# Patient Record
Sex: Male | Born: 1970 | Hispanic: Yes | Smoking: Never smoker
Health system: Southern US, Community
[De-identification: ages and names within clinical notes are randomized; demographics above are authoritative.]

## PROBLEM LIST (undated history)

## (undated) DIAGNOSIS — E785 Hyperlipidemia, unspecified: Secondary | ICD-10-CM

## (undated) HISTORY — DX: Hyperlipidemia, unspecified: E78.5

## (undated) HISTORY — PX: NO PAST SURGERIES: SHX2092

---

## 2019-10-05 ENCOUNTER — Ambulatory Visit (INDEPENDENT_AMBULATORY_CARE_PROVIDER_SITE_OTHER): Payer: 59 | Admitting: Family Medicine

## 2019-10-05 ENCOUNTER — Encounter: Payer: Self-pay | Admitting: Family Medicine

## 2019-10-05 ENCOUNTER — Ambulatory Visit (INDEPENDENT_AMBULATORY_CARE_PROVIDER_SITE_OTHER)
Admission: RE | Admit: 2019-10-05 | Discharge: 2019-10-05 | Disposition: A | Discharge status: Home / Self Care | Payer: 59 | Source: Ambulatory Visit | Attending: Family Medicine | Admitting: Family Medicine

## 2019-10-05 ENCOUNTER — Other Ambulatory Visit: Payer: Self-pay

## 2019-10-05 VITALS — BP 118/70 | HR 72 | Temp 98.4°F | Ht 66.0 in | Wt 186.8 lb

## 2019-10-05 DIAGNOSIS — E781 Pure hyperglyceridemia: Secondary | ICD-10-CM

## 2019-10-05 DIAGNOSIS — M898X8 Other specified disorders of bone, other site: Secondary | ICD-10-CM | POA: Diagnosis not present

## 2019-10-05 DIAGNOSIS — R350 Frequency of micturition: Secondary | ICD-10-CM | POA: Diagnosis not present

## 2019-10-05 DIAGNOSIS — R0989 Other specified symptoms and signs involving the circulatory and respiratory systems: Secondary | ICD-10-CM | POA: Diagnosis not present

## 2019-10-05 DIAGNOSIS — D649 Anemia, unspecified: Secondary | ICD-10-CM | POA: Diagnosis not present

## 2019-10-05 DIAGNOSIS — E785 Hyperlipidemia, unspecified: Secondary | ICD-10-CM

## 2019-10-05 DIAGNOSIS — Z7689 Persons encountering health services in other specified circumstances: Secondary | ICD-10-CM | POA: Diagnosis not present

## 2019-10-05 NOTE — Progress Notes (Signed)
Subjective:    Patient ID: Nathaniel Cook, male    DOB: [birth year], 49 y.o.   MRN: [id number]  HPI Chief Complaint  Patient presents with  . New Patient (Initial Visit)    Trouble with abdomen - lumps at sternum //  Dizziness x 5-6 months - started only having 1-2 times per week but now every day - Establish care.     This is a 49 yo male who presents today with above cc and to establish care. Jamas Lav is interpreter. He works in [employer]. Lives with his brother. Enjoys walking. Has one brother and three sisters.   Last CPE- at [facility], in the last year PSA- unknown Colonoscopy- never Tdap-unsure, will obtain records Flu-declines Dental-not regular Eye-not regular Exercise-active at work and enjoys walking  Last seen at [facility] 10/20. Was told that his triglycerides were high and he had anemia.   Bumps along sternum for many years. Not painful, not getting bigger.   Dizziness- comes and goes, no trigger. Better with laying down. Today is better. Started last week. Every day. Feels dizzy when in the car, riding with his brother. No nausea. Headaches across front. Occasional left ear pain. Two weeks ago had runny nose and cough. One day of fever.  Feels very well today with no complaints.  No history of elevated blood sugar. Father with diabetes.   Review of Systems  No chest pain, no SOB, no diarrhea/ constipation/blood in stool. Some urinary frequency, nocturia x 1. No change in weight. No night sweats.        Objective:   Physical Exam Vitals reviewed.  Constitutional:      General: He is not in acute distress.    Appearance: Normal appearance. He is obese. He is not ill-appearing, toxic-appearing or diaphoretic.  HENT:     Right Ear: Tympanic membrane, ear canal and external ear normal.     Left Ear: Tympanic membrane, ear canal and external ear normal.  Eyes:     Conjunctiva/sclera: Conjunctivae normal.  Cardiovascular:      Rate and Rhythm: Normal rate and regular rhythm.     Heart sounds: Normal heart sounds.  Pulmonary:     Effort: Pulmonary effort is normal.     Comments: Slightly decreased breath sounds posteriorly. Chest:     Chest wall: Mass (superficial, round, palpable lesions lower sternum. Approximately 1-1.5 cm. ) present. No tenderness.  Abdominal:     General: Abdomen is flat. Bowel sounds are normal. There is no distension.     Palpations: Abdomen is soft. There is no mass.     Tenderness: There is no abdominal tenderness. There is no guarding or rebound.     Hernia: No hernia is present.  Musculoskeletal:     Cervical back: Normal range of motion and neck supple. No rigidity or tenderness.     Right lower leg: No edema.     Left lower leg: No edema.  Lymphadenopathy:     Cervical: No cervical adenopathy.  Skin:    General: Skin is warm and dry.     Comments: Lesions similar in size and consistency on bilateral upper inner arms.  Also x 1 on back.  Neurological:     Mental Status: He is alert and oriented to person, place, and time.  Psychiatric:        Mood and Affect: Mood normal.        Behavior: Behavior normal.        Thought  Content: Thought content normal.        Judgment: Judgment normal.       BP 118/70 (BP Location: Right Arm, Patient Position: Sitting, Cuff Size: Normal)   Pulse 72   Temp 98.4 F (36.9 C) (Temporal)   Ht 5\' 6"  (1.676 m) Comment: 5  Wt 186 lb 12.8 oz (84.7 kg)   SpO2 98%   BMI 30.15 kg/m      Assessment & Plan:  1. Encounter to establish care -We will request records from prior PCP  2. Anemia, unspecified type -Per patient report, will check labs - CBC with Differential - Ferritin  3. High triglycerides -Per patient report, will check labs - Lipid Panel - Comprehensive metabolic panel  4. Urinary frequency -Family history of diabetes - Hemoglobin A1c  5. Sternal mass -Could be lipomas as he has similar lesions on his arms and  back, will check x-ray - DG Sternum; Future  6. Abnormal lung sounds -Decreased breath sounds, will check chest x-ray along with x-ray of sternum - DG Chest 2 View; Future  He reports that there is someone at home who can help with translation on the phone regarding results.  -Follow-up to be determined based on lab and x-ray findings  This visit occurred during the SARS-CoV-2 public health emergency.  Safety protocols were in place, including screening questions prior to the visit, additional usage of staff PPE, and extensive cleaning of exam room while observing appropriate contact time as indicated for disinfecting solutions.     , FNP-BC  [facility], [facility]  10/06/2019 12:06 PM

## 2019-10-05 NOTE — Patient Instructions (Signed)
Good to meet you today  Our office will call with results and make a follow up appointment  Call us if you have any problems or questions

## 2019-10-06 LAB — LDL CHOLESTEROL, DIRECT: Direct LDL: 142 mg/dL

## 2019-10-06 LAB — CBC WITH DIFFERENTIAL/PLATELET
Basophils Absolute: 0 10*3/uL (ref 0.0–0.1)
Basophils Relative: 0.6 % (ref 0.0–3.0)
Eosinophils Absolute: 0.1 10*3/uL (ref 0.0–0.7)
Eosinophils Relative: 1.7 % (ref 0.0–5.0)
HCT: 47.2 % (ref 39.0–52.0)
Hemoglobin: 16.1 g/dL (ref 13.0–17.0)
Lymphocytes Relative: 26.5 % (ref 12.0–46.0)
Lymphs Abs: 1.9 10*3/uL (ref 0.7–4.0)
MCHC: 34.1 g/dL (ref 30.0–36.0)
MCV: 84.5 fl (ref 78.0–100.0)
Monocytes Absolute: 0.5 10*3/uL (ref 0.1–1.0)
Monocytes Relative: 6.6 % (ref 3.0–12.0)
Neutro Abs: 4.6 10*3/uL (ref 1.4–7.7)
Neutrophils Relative %: 64.6 % (ref 43.0–77.0)
Platelets: 155 10*3/uL (ref 150.0–400.0)
RBC: 5.59 Mil/uL (ref 4.22–5.81)
RDW: 12.8 % (ref 11.5–15.5)
WBC: 7.1 10*3/uL (ref 4.0–10.5)

## 2019-10-06 LAB — LIPID PANEL
Cholesterol: 211 mg/dL — ABNORMAL HIGH (ref 0–200)
HDL: 35.6 mg/dL — ABNORMAL LOW (ref >39.00)
NonHDL: 174.91
Total CHOL/HDL Ratio: 6
Triglycerides: 348 mg/dL — ABNORMAL HIGH (ref 0.0–149.0)
VLDL: 69.6 mg/dL — ABNORMAL HIGH (ref 0.0–40.0)

## 2019-10-06 LAB — COMPREHENSIVE METABOLIC PANEL
ALT: 16 U/L (ref 0–53)
AST: 15 U/L (ref 0–37)
Albumin: 4.2 g/dL (ref 3.5–5.2)
Alkaline Phosphatase: 88 U/L (ref 39–117)
BUN: 18 mg/dL (ref 6–23)
CO2: 28 mEq/L (ref 19–32)
Calcium: 9.5 mg/dL (ref 8.4–10.5)
Chloride: 105 mEq/L (ref 96–112)
Creatinine, Ser: 1.08 mg/dL (ref 0.40–1.50)
GFR: 72.86 mL/min (ref >60.00)
Glucose, Bld: 103 mg/dL — ABNORMAL HIGH (ref 70–99)
Potassium: 3.8 mEq/L (ref 3.5–5.1)
Sodium: 140 mEq/L (ref 135–145)
Total Bilirubin: 0.5 mg/dL (ref 0.2–1.2)
Total Protein: 7 g/dL (ref 6.0–8.3)

## 2019-10-06 LAB — FERRITIN: Ferritin: 85.9 ng/mL (ref 22.0–322.0)

## 2019-10-06 LAB — HEMOGLOBIN A1C: Hgb A1c MFr Bld: 5.3 % (ref 4.6–6.5)

## 2019-10-06 MED ORDER — ATORVASTATIN CALCIUM 20 MG PO TABS: 20.0000 mg | ORAL_TABLET | Freq: Every day | ORAL | 3 refills | Status: DC

## 2020-11-07 ENCOUNTER — Encounter: Payer: Self-pay | Admitting: Family Medicine

## 2020-11-07 ENCOUNTER — Other Ambulatory Visit: Payer: Self-pay

## 2020-11-07 ENCOUNTER — Ambulatory Visit: Payer: 59 | Admitting: Family Medicine

## 2020-11-07 VITALS — BP 110/80 | HR 70 | Temp 96.8°F | Ht 68.5 in | Wt 186.0 lb

## 2020-11-07 DIAGNOSIS — Z1211 Encounter for screening for malignant neoplasm of colon: Secondary | ICD-10-CM

## 2020-11-07 DIAGNOSIS — E782 Mixed hyperlipidemia: Secondary | ICD-10-CM | POA: Diagnosis not present

## 2020-11-07 DIAGNOSIS — Z0000 Encounter for general adult medical examination without abnormal findings: Secondary | ICD-10-CM

## 2020-11-07 DIAGNOSIS — Z131 Encounter for screening for diabetes mellitus: Secondary | ICD-10-CM | POA: Diagnosis not present

## 2020-11-07 DIAGNOSIS — Z125 Encounter for screening for malignant neoplasm of prostate: Secondary | ICD-10-CM

## 2020-11-07 DIAGNOSIS — Z79899 Other long term (current) drug therapy: Secondary | ICD-10-CM

## 2020-11-07 LAB — CBC WITH DIFFERENTIAL/PLATELET
Basophils Absolute: 0 10*3/uL (ref 0.0–0.1)
Basophils Relative: 0.6 % (ref 0.0–3.0)
Eosinophils Absolute: 0.7 10*3/uL (ref 0.0–0.7)
Eosinophils Relative: 10.2 % — ABNORMAL HIGH (ref 0.0–5.0)
HCT: 50.1 % (ref 39.0–52.0)
Hemoglobin: 16.9 g/dL (ref 13.0–17.0)
Lymphocytes Relative: 28.2 % (ref 12.0–46.0)
Lymphs Abs: 1.8 10*3/uL (ref 0.7–4.0)
MCHC: 33.9 g/dL (ref 30.0–36.0)
MCV: 84.7 fl (ref 78.0–100.0)
Monocytes Absolute: 0.3 10*3/uL (ref 0.1–1.0)
Monocytes Relative: 5.3 % (ref 3.0–12.0)
Neutro Abs: 3.6 10*3/uL (ref 1.4–7.7)
Neutrophils Relative %: 55.7 % (ref 43.0–77.0)
Platelets: 150 10*3/uL (ref 150.0–400.0)
RBC: 5.91 Mil/uL — ABNORMAL HIGH (ref 4.22–5.81)
RDW: 13.1 % (ref 11.5–15.5)
WBC: 6.4 10*3/uL (ref 4.0–10.5)

## 2020-11-07 LAB — LIPID PANEL
Cholesterol: 238 mg/dL — ABNORMAL HIGH (ref 0–200)
HDL: 39 mg/dL — ABNORMAL LOW (ref >39.00)
LDL Cholesterol: 177 mg/dL — ABNORMAL HIGH (ref 0–99)
NonHDL: 199.48
Total CHOL/HDL Ratio: 6
Triglycerides: 114 mg/dL (ref 0.0–149.0)
VLDL: 22.8 mg/dL (ref 0.0–40.0)

## 2020-11-07 LAB — HEPATIC FUNCTION PANEL
ALT: 22 U/L (ref 0–53)
AST: 22 U/L (ref 0–37)
Albumin: 4.3 g/dL (ref 3.5–5.2)
Alkaline Phosphatase: 89 U/L (ref 39–117)
Bilirubin, Direct: 0.1 mg/dL (ref 0.0–0.3)
Total Bilirubin: 0.5 mg/dL (ref 0.2–1.2)
Total Protein: 7.2 g/dL (ref 6.0–8.3)

## 2020-11-07 LAB — BASIC METABOLIC PANEL
BUN: 18 mg/dL (ref 6–23)
CO2: 32 mEq/L (ref 19–32)
Calcium: 9.7 mg/dL (ref 8.4–10.5)
Chloride: 103 mEq/L (ref 96–112)
Creatinine, Ser: 1.15 mg/dL (ref 0.40–1.50)
GFR: 74.75 mL/min (ref >60.00)
Glucose, Bld: 97 mg/dL (ref 70–99)
Potassium: 4.9 mEq/L (ref 3.5–5.1)
Sodium: 139 mEq/L (ref 135–145)

## 2020-11-07 LAB — HEMOGLOBIN A1C: Hgb A1c MFr Bld: 5.3 % (ref 4.6–6.5)

## 2020-11-07 NOTE — Progress Notes (Signed)
Angelia Hazell T. Farin Buhman, MD, CAQ Sports Medicine  Primary Care and Sports Medicine [facility] at [facility] [address] [place], [address]  Phone: [phone]  FAX: [phone]  Nathaniel Cook - 50 y.o. male  MRN [id number]  Date of Birth: 18-Oct-1970  Date: 11/07/2020  PCP: Emi Belfast, FNP (Inactive)  Referral: No ref. provider found  Chief Complaint  Patient presents with  . Annual Exam  . Itchy Throat  . Nasal Congestion    This visit occurred during the SARS-CoV-2 public health emergency.  Safety protocols were in place, including screening questions prior to the visit, additional usage of staff PPE, and extensive cleaning of exam room while observing appropriate contact time as indicated for disinfecting solutions.   Patient Care Team: Emi Belfast, FNP (Inactive) as PCP - General (Nurse Practitioner) Subjective:   Nathaniel Cook is a 50 y.o. pleasant patient who presents with the following:  Preventative Health Maintenance Visit:  Health Maintenance Summary Reviewed and updated, unless pt declines services.  Tobacco History Reviewed. Alcohol: No concerns, no excessive use Exercise Habits: Some activity, rec at least 30 mins 5 times a week STD concerns: no risk or activity to increase risk Drug Use: None  Colon Pan-lab, hep c, hiv tdap?  covid vacc - has had 2 shots  Worried about an itchy throat.  Some benadryl will help some.   Stopped his chol meds - stopped  History reviewed. No pertinent past medical history.  Past Surgical History:  Procedure Laterality Date  . NO PAST SURGERIES      Family History  Problem Relation Age of Onset  . Hypertension Mother     Past Medical History, Surgical History, Social History, Family History, Problem List, Medications, and Allergies have been reviewed and updated if relevant.  Review of Systems: Pertinent positives are listed above.  Otherwise, a full 14 point review  of systems has been done in full and it is negative except where it is noted positive.  Objective:   BP 110/80   Pulse 70   Temp (!) 96.8 F (36 C) (Temporal)   Ht 5' 8.5" (1.74 m)   Wt 186 lb (84.4 kg)   SpO2 98%   BMI 27.87 kg/m  Ideal Body Weight: Weight in (lb) to have BMI = 25: 166.5  Ideal Body Weight: Weight in (lb) to have BMI = 25: 166.5 No exam data present Depression screen [facility] 2/9 11/07/2020 10/05/2019  Decreased Interest 0 2  Down, Depressed, Hopeless 0 3  PHQ - 2 Score 0 5  Altered sleeping - 0  Tired, decreased energy - 2  Change in appetite - 2  Feeling bad or failure about yourself  - 1  Trouble concentrating - 2  Moving slowly or fidgety/restless - 1  Suicidal thoughts - 0  PHQ-9 Score - 13  Difficult doing work/chores - Not difficult at all     GEN: well developed, well nourished, no acute distress Eyes: conjunctiva and lids normal, PERRLA, EOMI ENT: TM clear, nares clear, oral exam WNL Neck: supple, no lymphadenopathy, no thyromegaly, no JVD Pulm: clear to auscultation and percussion, respiratory effort normal CV: regular rate and rhythm, S1-S2, no murmur, rub or gallop, no bruits, peripheral pulses normal and symmetric, no cyanosis, clubbing, edema or varicosities GI: soft, non-tender; no hepatosplenomegaly, masses; active bowel sounds all quadrants GU: deferred Lymph: no cervical, axillary or inguinal adenopathy MSK: gait normal, muscle tone and strength WNL, no joint swelling, effusions, discoloration,  crepitus  SKIN: clear, good turgor, color WNL, no rashes, lesions, or ulcerations Neuro: normal mental status, normal strength, sensation, and motion Psych: alert; oriented to person, place and time, normally interactive and not anxious or depressed in appearance.  All labs reviewed with patient. Results for orders placed or performed in visit on 11/07/20  Basic metabolic panel  Result Value Ref Range   Sodium 139 135 - 145 mEq/L   Potassium 4.9  3.5 - 5.1 mEq/L   Chloride 103 96 - 112 mEq/L   CO2 32 19 - 32 mEq/L   Glucose, Bld 97 70 - 99 mg/dL   BUN 18 6 - 23 mg/dL   Creatinine, Ser 2.69 0.40 - 1.50 mg/dL   GFR 48.54 >62.70 mL/min   Calcium 9.7 8.4 - 10.5 mg/dL  CBC with Differential/Platelet  Result Value Ref Range   WBC 6.4 4.0 - 10.5 K/uL   RBC 5.91 (H) 4.22 - 5.81 Mil/uL   Hemoglobin 16.9 13.0 - 17.0 g/dL   HCT 35.0 09.3 - 81.8 %   MCV 84.7 78.0 - 100.0 fl   MCHC 33.9 30.0 - 36.0 g/dL   RDW 29.9 37.1 - 69.6 %   Platelets 150.0 150.0 - 400.0 K/uL   Neutrophils Relative % 55.7 43.0 - 77.0 %   Lymphocytes Relative 28.2 12.0 - 46.0 %   Monocytes Relative 5.3 3.0 - 12.0 %   Eosinophils Relative 10.2 (H) 0.0 - 5.0 %   Basophils Relative 0.6 0.0 - 3.0 %   Neutro Abs 3.6 1.4 - 7.7 K/uL   Lymphs Abs 1.8 0.7 - 4.0 K/uL   Monocytes Absolute 0.3 0.1 - 1.0 K/uL   Eosinophils Absolute 0.7 0.0 - 0.7 K/uL   Basophils Absolute 0.0 0.0 - 0.1 K/uL  Hepatic function panel  Result Value Ref Range   Total Bilirubin 0.5 0.2 - 1.2 mg/dL   Bilirubin, Direct 0.1 0.0 - 0.3 mg/dL   Alkaline Phosphatase 89 39 - 117 U/L   AST 22 0 - 37 U/L   ALT 22 0 - 53 U/L   Total Protein 7.2 6.0 - 8.3 g/dL   Albumin 4.3 3.5 - 5.2 g/dL  Lipid panel  Result Value Ref Range   Cholesterol 238 (H) 0 - 200 mg/dL   Triglycerides 789.3 0.0 - 149.0 mg/dL   HDL 81.01 (L) >75.10 mg/dL   VLDL 25.8 0.0 - 52.7 mg/dL   LDL Cholesterol 782 (H) 0 - 99 mg/dL   Total CHOL/HDL Ratio 6    NonHDL 199.48   Hemoglobin A1c  Result Value Ref Range   Hgb A1c MFr Bld 5.3 4.6 - 6.5 %    Assessment and Plan:     ICD-10-CM   1. Healthcare maintenance  Z00.00   2. Colon cancer screening  Z12.11 Ambulatory referral to Gastroenterology  3. Mixed hyperlipidemia  E78.2 Lipid panel  4. Screening for diabetes mellitus  Z13.1 Basic metabolic panel    Hemoglobin A1c  5. Screening PSA (prostate specific antigen)  Z12.5 PSA, Total with Reflex to PSA, Free  6. Encounter for  long-term (current) use of medications  Z79.899 CBC with Differential/Platelet    Hepatic function panel   Overall, he is doing well.  I encouraged him to be as active as possible.  We also need to do some baseline labs.  At the time of this dictation, labs have returned and his cholesterol has increased off of medication.  The 10-year ASCVD risk score Denman George DC Montez Hageman., et al., 2013)  is: 4.1%   Values used to calculate the score:     Age: 68 years     Sex: Male     Is Non-Hispanic African American: No     Diabetic: No     Tobacco smoker: No     Systolic Blood Pressure: 110 mmHg     Is BP treated: No     HDL Cholesterol: 39 mg/dL     Total Cholesterol: 238 mg/dL   Health Maintenance Exam: The patient's preventative maintenance and recommended screening tests for an annual wellness exam were reviewed in full today. Brought up to date unless services declined.  Counselled on the importance of diet, exercise, and its role in overall health and mortality. The patient's FH and SH was reviewed, including their home life, tobacco status, and drug and alcohol status.  Follow-up in 1 year for physical exam or additional follow-up below.  Follow-up: No follow-ups on file. Or follow-up in 1 year if not noted.  No orders of the defined types were placed in this encounter.  There are no discontinued medications. Orders Placed This Encounter  Procedures  . Basic metabolic panel  . CBC with Differential/Platelet  . Hepatic function panel  . Lipid panel  . Hemoglobin A1c  . PSA, Total with Reflex to PSA, Free  . Ambulatory referral to Gastroenterology    Signed,  Karleen Hampshire T. Vick Filter, MD   Allergies as of 11/07/2020   No Known Allergies     Medication List       Accurate as of November 07, 2020 11:59 PM. If you have any questions, ask your nurse or doctor.        atorvastatin 20 MG tablet Commonly known as: LIPITOR Take 1 tablet (20 mg total) by mouth daily.   cholecalciferol 25  MCG (1000 UNIT) tablet Commonly known as: VITAMIN D3 Take 1,000 Units by mouth daily.

## 2020-11-07 NOTE — Patient Instructions (Signed)
ALLERGIES -Sneezing, runny and stuffy nose, itchy eyes, nose, throat, tears.  TREATMENT 1. Avoid offending allergen 4. Anti-histamines. Claritin (generic loratadine), Zyrtec (generic certrizine), and Allegra (fexofenadine) are all over the counter now. Cheap at [employer], [employer], [employer]. Pick 1 - sometimes it is helpful to try and see which one helps you the most or vary it up some over time.

## 2020-11-08 LAB — PSA, TOTAL WITH REFLEX TO PSA, FREE: PSA, Total: 0.4 ng/mL (ref <=4.0)

## 2020-11-15 ENCOUNTER — Other Ambulatory Visit: Payer: Self-pay | Admitting: *Deleted

## 2020-11-15 DIAGNOSIS — E785 Hyperlipidemia, unspecified: Secondary | ICD-10-CM

## 2020-11-16 MED ORDER — ATORVASTATIN CALCIUM 20 MG PO TABS: 20.0000 mg | ORAL_TABLET | Freq: Every day | ORAL | 3 refills | Status: DC

## 2020-11-16 NOTE — Telephone Encounter (Signed)
Spoke with [employer].  He is agreeable to restarting his cholesterol medication.  He is asking that the Rx be sent to [facility] in [place], [place].  Ok to send in atorvastatin 20 mg that he was previously taking?

## 2020-11-17 ENCOUNTER — Encounter: Payer: Self-pay | Admitting: *Deleted

## 2021-02-20 ENCOUNTER — Ambulatory Visit: Payer: 59 | Admitting: Family Medicine

## 2021-02-20 ENCOUNTER — Other Ambulatory Visit: Payer: Self-pay

## 2021-02-20 VITALS — BP 112/70 | HR 62 | Temp 98.0°F | Wt 186.5 lb

## 2021-02-20 DIAGNOSIS — N410 Acute prostatitis: Secondary | ICD-10-CM | POA: Insufficient documentation

## 2021-02-20 DIAGNOSIS — R300 Dysuria: Secondary | ICD-10-CM | POA: Diagnosis not present

## 2021-02-20 LAB — POC URINALSYSI DIPSTICK (AUTOMATED)
Bilirubin, UA: NEGATIVE
Blood, UA: NEGATIVE
Glucose, UA: NEGATIVE
Ketones, UA: NEGATIVE
Leukocytes, UA: NEGATIVE
Nitrite, UA: NEGATIVE
Protein, UA: NEGATIVE
Spec Grav, UA: 1.02 (ref 1.010–1.025)
Urobilinogen, UA: 0.2 E.U./dL
pH, UA: 6 (ref 5.0–8.0)

## 2021-02-20 MED ORDER — SULFAMETHOXAZOLE-TRIMETHOPRIM 800-160 MG PO TABS: 1.0000 | ORAL_TABLET | Freq: Two times a day (BID) | ORAL | 0 refills | Status: AC

## 2021-02-20 NOTE — Progress Notes (Signed)
Subjective:     Nathaniel Cook is a 50 y.o. male presenting for Urinary Frequency and Dysuria (X 3 days)     Urinary Frequency  Associated symptoms include frequency.  Dysuria  Associated symptoms include frequency.   #urinary frequency - for 3 days - will get the sensation to urinate - urinating small volumes - difficulty emptying the bladder - feels symptoms are are already improved - the burning sensation is still present - frequency has improved - in the morning when he gets up the urine stops mid stream - no recent unprotected - no penile discharge or pain  No blood in the urine  Minimal caffeine/carbonation Does not drink alcohol regularly - not for 1 month   Had issues 4-5 years ago - and was diagnosed with prostate infection   Review of Systems  Genitourinary:  Positive for dysuria and frequency.    Social History   Tobacco Use  Smoking Status Never  Smokeless Tobacco Never        Objective:    BP Readings from Last 3 Encounters:  02/20/21 112/70  11/07/20 110/80  10/05/19 118/70   Wt Readings from Last 3 Encounters:  02/20/21 186 lb 8 oz (84.6 kg)  11/07/20 186 lb (84.4 kg)  10/05/19 186 lb 12.8 oz (84.7 kg)    BP 112/70   Pulse 62   Temp 98 F (36.7 C) (Temporal)   Wt 186 lb 8 oz (84.6 kg)   SpO2 98%   BMI 27.94 kg/m    Physical Exam Exam conducted with a chaperone present.  Constitutional:      Appearance: Normal appearance. He is not ill-appearing or diaphoretic.  HENT:     Right Ear: External ear normal.     Left Ear: External ear normal.     Nose: Nose normal.  Eyes:     General: No scleral icterus.    Extraocular Movements: Extraocular movements intact.     Conjunctiva/sclera: Conjunctivae normal.  Cardiovascular:     Rate and Rhythm: Normal rate and regular rhythm.     Heart sounds: No murmur heard. Pulmonary:     Effort: Pulmonary effort is normal. No respiratory distress.     Breath sounds: Normal breath sounds.  No wheezing.  Genitourinary:    Prostate: Tender.  Musculoskeletal:     Cervical back: Neck supple.  Skin:    General: Skin is warm and dry.  Neurological:     Mental Status: He is alert. Mental status is at baseline.  Psychiatric:        Mood and Affect: Mood normal.          Assessment & Plan:   Problem List Items Addressed This Visit       Genitourinary   Acute prostatitis without hematuria - Primary    Well appearing with bland urine but tender prostate. Given hx will treat with Abx. He will message if symptoms not improving and consider Urology.        Relevant Medications   sulfamethoxazole-trimethoprim (BACTRIM DS) 800-160 MG tablet   Other Relevant Orders   Urine Culture   Other Visit Diagnoses     Dysuria       Relevant Medications   sulfamethoxazole-trimethoprim (BACTRIM DS) 800-160 MG tablet   Other Relevant Orders   POCT Urinalysis Dipstick (Automated) (Completed)   Urine Culture        Return if symptoms worsen or fail to improve.  Lynnda Child, MD  This visit occurred during the  SARS-CoV-2 public health emergency.  Safety protocols were in place, including screening questions prior to the visit, additional usage of staff PPE, and extensive cleaning of exam room while observing appropriate contact time as indicated for disinfecting solutions.

## 2021-02-20 NOTE — Patient Instructions (Signed)
Prostate infection  - You will need antibiotics for 4 weeks total - if you do not notice improvement in symptoms within 3 days call the clinic. It may be reasonable to get urology opinion on the diagnosis.

## 2021-02-20 NOTE — Assessment & Plan Note (Signed)
Well appearing with bland urine but tender prostate. Given hx will treat with Abx. He will message if symptoms not improving and consider Urology.

## 2021-02-21 LAB — URINE CULTURE
MICRO NUMBER:: 12158527
Result:: NO GROWTH
SPECIMEN QUALITY:: ADEQUATE

## 2021-03-05 IMAGING — DX DG STERNUM 2+V
3 series · 3 of 3 positions shown · non-contrast
Comparison: None.

CLINICAL DATA: Sternal mass.

EXAM:
STERNUM - 2+ VIEW

[sternum obl (1 of 2)]
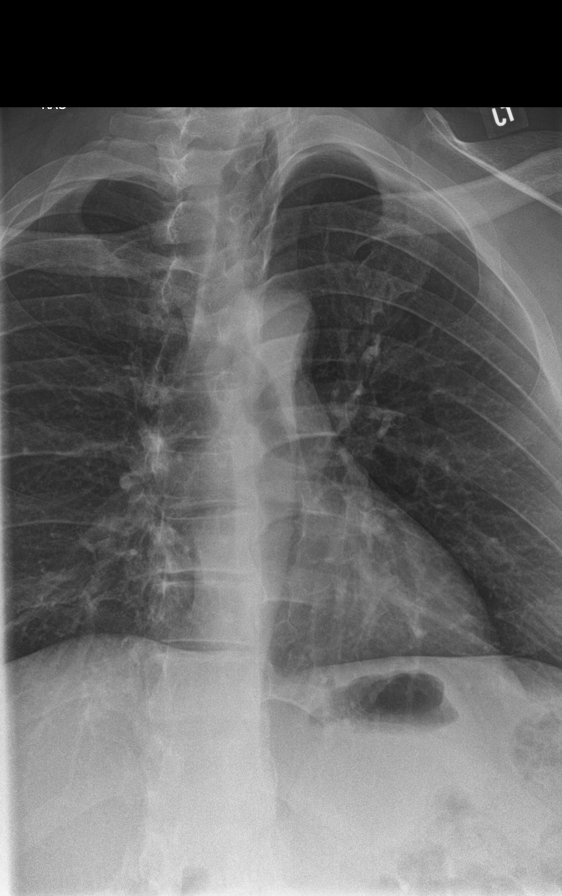

[sternum lat]
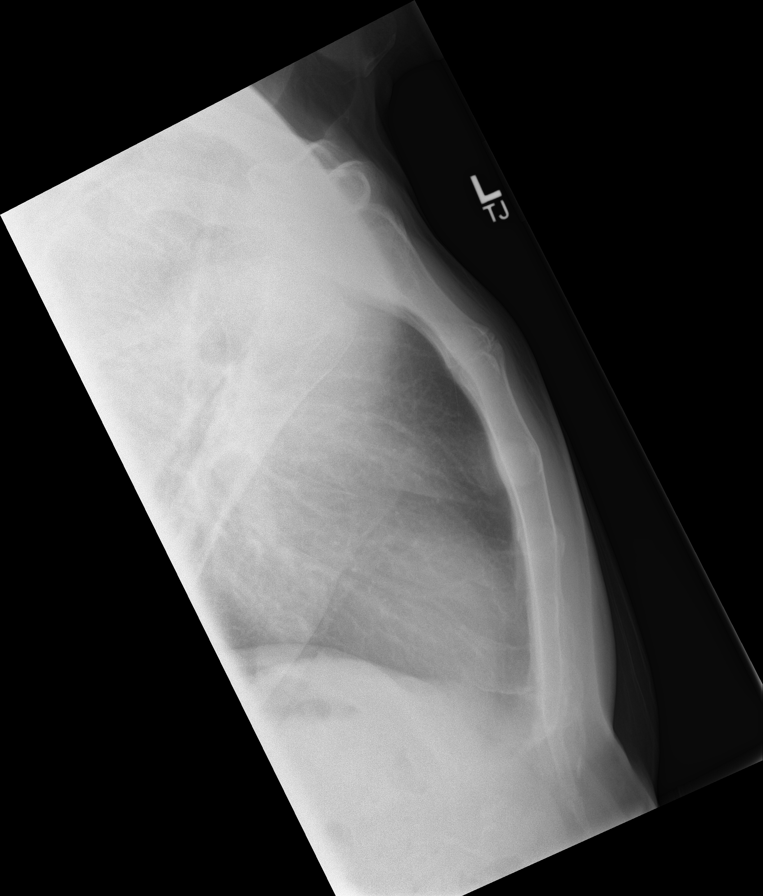

[sternum obl (2 of 2)]
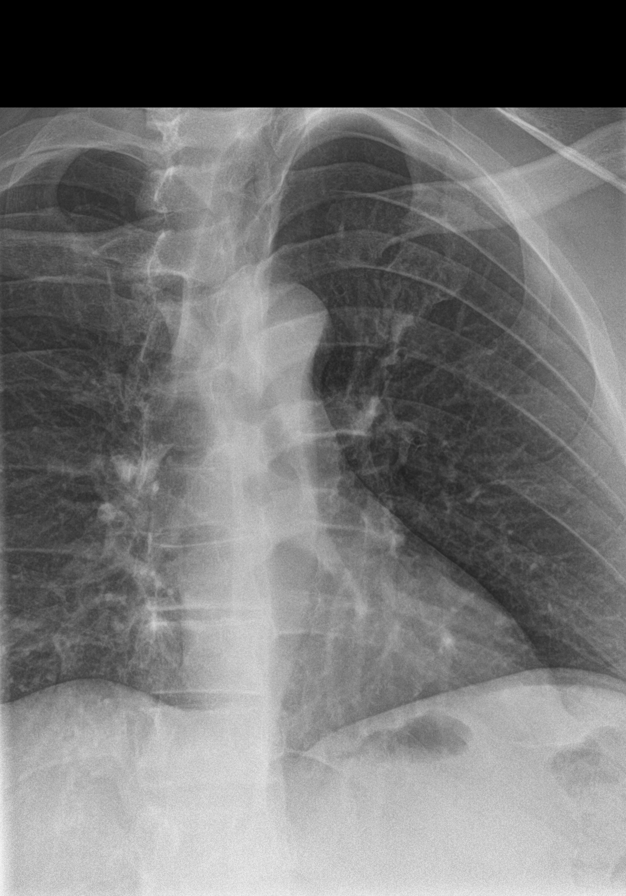

[3 of 3 positions shown; findings below may reference images not displayed]

FINDINGS: There is no evidence of fracture or other focal bone lesions.
IMPRESSION: Negative exam.

## 2021-03-05 IMAGING — DX DG CHEST 2V
2 series · 2 of 2 positions shown · non-contrast
Comparison: None.

CLINICAL DATA: Abnormal breath sounds.

EXAM:
CHEST - 2 VIEW

[chest pa]
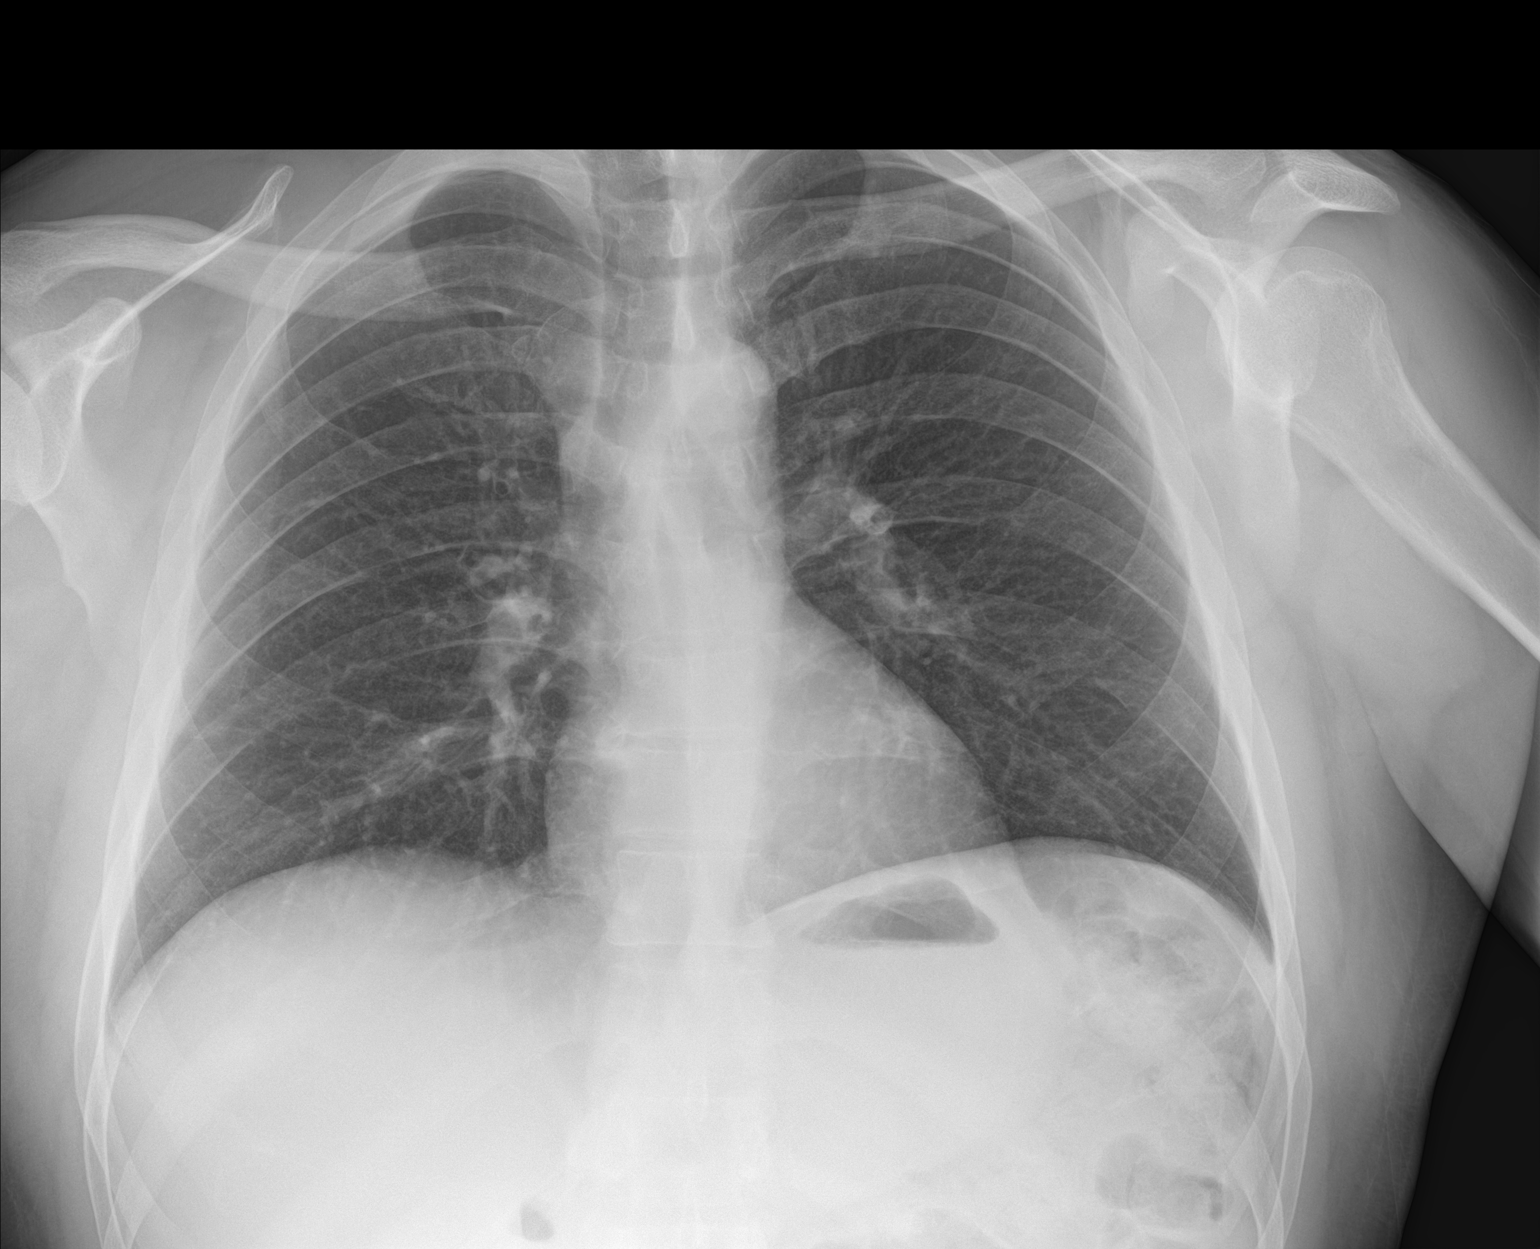

[chest lat]
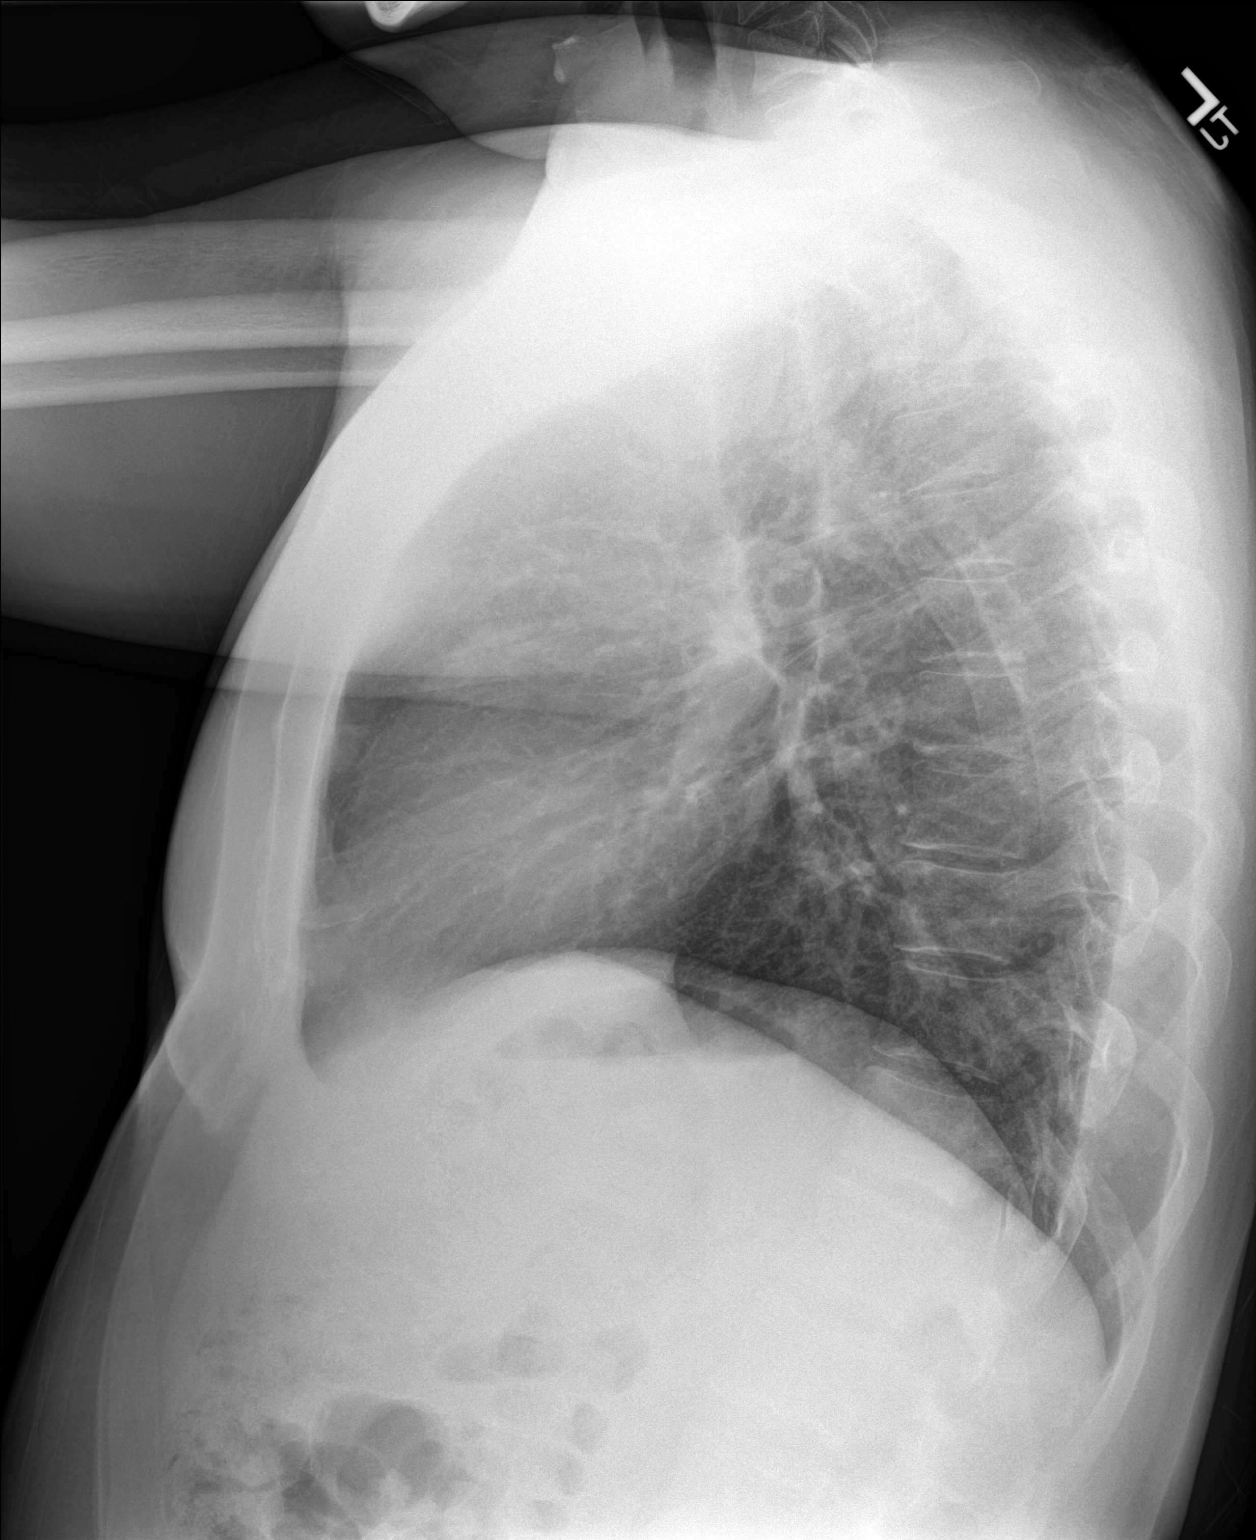

[2 of 2 positions shown; findings below may reference images not displayed]

FINDINGS: The heart size and mediastinal contours are within normal limits.
The lungs are clear. The visualized skeletal structures are
unremarkable.
IMPRESSION: Negative chest.

## 2021-11-21 ENCOUNTER — Ambulatory Visit: Payer: 59 | Admitting: Family Medicine

## 2021-11-21 VITALS — BP 110/78 | HR 82 | Temp 98.0°F | Wt 187.2 lb

## 2021-11-21 DIAGNOSIS — E785 Hyperlipidemia, unspecified: Secondary | ICD-10-CM | POA: Diagnosis not present

## 2021-11-21 DIAGNOSIS — J302 Other seasonal allergic rhinitis: Secondary | ICD-10-CM

## 2021-11-21 DIAGNOSIS — E782 Mixed hyperlipidemia: Secondary | ICD-10-CM

## 2021-11-21 MED ORDER — ATORVASTATIN CALCIUM 20 MG PO TABS: 20.0000 mg | ORAL_TABLET | Freq: Every day | ORAL | 0 refills | Status: DC

## 2021-11-21 NOTE — Progress Notes (Signed)
? ?Subjective:  ? ?  ?Nathaniel Cook is a 51 y.o. male presenting for Nasal Congestion and Sore Throat (X 2-3 months ) ?  ? ? ?Sore Throat  ?This is a new problem. The current episode started more than 1 month ago. Associated symptoms include congestion and ear pain. Pertinent negatives include no coughing, headaches, shortness of breath or trouble swallowing. Treatments tried: claritin. The treatment provided no relief.  ? ?Has not tried flonase  ?Endorses some heartburn - which he takes occasionally ? ?Tried claritin and allegra and zyrtec ? ?Review of Systems  ?Constitutional:  Negative for chills and fever.  ?HENT:  Positive for congestion, ear pain, postnasal drip, rhinorrhea, sneezing and sore throat (itchy). Negative for trouble swallowing.   ?Eyes:  Negative for pain, discharge, redness and itching.  ?Respiratory:  Negative for cough, chest tightness and shortness of breath.   ?Cardiovascular:  Negative for chest pain.  ?Neurological:  Negative for headaches.  ? ? ?Social History  ? ?Tobacco Use  ?Smoking Status Never  ?Smokeless Tobacco Never  ? ? ? ?   ?Objective:  ?  ?BP Readings from Last 3 Encounters:  ?11/21/21 110/78  ?02/20/21 112/70  ?11/07/20 110/80  ? ?Wt Readings from Last 3 Encounters:  ?11/21/21 187 lb 4 oz (84.9 kg)  ?02/20/21 186 lb 8 oz (84.6 kg)  ?11/07/20 186 lb (84.4 kg)  ? ? ?BP 110/78   Pulse 82   Temp 98 ?F (36.7 ?C) (Oral)   Wt 187 lb 4 oz (84.9 kg)   SpO2 97%   BMI 28.06 kg/m?  ? ? ?Physical Exam ?Constitutional:   ?   Appearance: Normal appearance. He is not ill-appearing or diaphoretic.  ?HENT:  ?   Head: Normocephalic and atraumatic.  ?   Right Ear: External ear normal. Tympanic membrane is scarred.  ?   Left Ear: External ear normal. Tympanic membrane is scarred.  ?   Nose: Mucosal edema and rhinorrhea present. Rhinorrhea is clear.  ?   Mouth/Throat:  ?   Mouth: Mucous membranes are moist.  ?   Pharynx: Posterior oropharyngeal erythema present. No oropharyngeal exudate.  ?    Tonsils: 0 on the right. 0 on the left.  ?Eyes:  ?   General: No scleral icterus. ?   Extraocular Movements: Extraocular movements intact.  ?   Conjunctiva/sclera: Conjunctivae normal.  ?Cardiovascular:  ?   Rate and Rhythm: Normal rate and regular rhythm.  ?   Heart sounds: No murmur heard. ?Pulmonary:  ?   Effort: Pulmonary effort is normal. No respiratory distress.  ?   Breath sounds: Normal breath sounds. No wheezing.  ?Musculoskeletal:  ?   Cervical back: Neck supple.  ?Skin: ?   General: Skin is warm and dry.  ?Neurological:  ?   Mental Status: He is alert. Mental status is at baseline.  ?Psychiatric:     ?   Mood and Affect: Mood normal.  ? ? ? ? ? ?   ?Assessment & Plan:  ? ?Problem List Items Addressed This Visit   ? ?  ? Other  ? Seasonal allergies - Primary  ?  Has failed several daily allergy pills, but has not tried Flonase.  Suspect this is allergic rhinitis with postnasal drip advised course of Flonase and trial of Xyzal.  If no improvement return in 1 month at that time would probably consider ear nose and throat referral and/or allergy referral. ? ?  ?  ? Mixed hyperlipidemia  ?  Continue atorvastatin 20 mg.  Advised follow-up and transfer of care appointment with new provider in the office in approximately 1 month, advised fasting so that he can get blood work at that time. ? ?  ?  ? Relevant Medications  ? atorvastatin (LIPITOR) 20 MG tablet  ? ?Other Visit Diagnoses   ? ? Dyslipidemia with elevated low density lipoprotein (LDL) cholesterol and abnormally low high density lipoprotein cholesterol      ? Relevant Medications  ? atorvastatin (LIPITOR) 20 MG tablet  ? ?  ? ? ? ?Return in about 4 weeks (around 12/19/2021) for [facility] with [facility] or any provider . ? ?Lynnda Child, MD ? ? ? ?

## 2021-11-21 NOTE — Assessment & Plan Note (Signed)
Continue atorvastatin 20 mg.  Advised follow-up and transfer of care appointment with new provider in the office in approximately 1 month, advised fasting so that he can get blood work at that time. ?

## 2021-11-21 NOTE — Patient Instructions (Addendum)
Allergies ?- Flonase (nasal steroid) - start with once daily, increase to twice daily if helpful ?- Change to either Xyzal  ?

## 2021-11-21 NOTE — Assessment & Plan Note (Signed)
Has failed several daily allergy pills, but has not tried Flonase.  Suspect this is allergic rhinitis with postnasal drip advised course of Flonase and trial of Xyzal.  If no improvement return in 1 month at that time would probably consider ear nose and throat referral and/or allergy referral. ?

## 2022-08-15 ENCOUNTER — Ambulatory Visit: Payer: BC Managed Care – PPO | Admitting: Nurse Practitioner

## 2022-08-15 ENCOUNTER — Encounter: Payer: Self-pay | Admitting: Nurse Practitioner

## 2022-08-15 VITALS — BP 122/78 | HR 68 | Ht 69.0 in | Wt 188.0 lb

## 2022-08-15 DIAGNOSIS — Z125 Encounter for screening for malignant neoplasm of prostate: Secondary | ICD-10-CM | POA: Diagnosis not present

## 2022-08-15 DIAGNOSIS — Z23 Encounter for immunization: Secondary | ICD-10-CM | POA: Diagnosis not present

## 2022-08-15 DIAGNOSIS — Z0000 Encounter for general adult medical examination without abnormal findings: Secondary | ICD-10-CM | POA: Diagnosis not present

## 2022-08-15 DIAGNOSIS — Z1211 Encounter for screening for malignant neoplasm of colon: Secondary | ICD-10-CM

## 2022-08-15 DIAGNOSIS — E782 Mixed hyperlipidemia: Secondary | ICD-10-CM | POA: Diagnosis not present

## 2022-08-15 DIAGNOSIS — E663 Overweight: Secondary | ICD-10-CM | POA: Diagnosis not present

## 2022-08-15 LAB — CBC
HCT: 49.5 % (ref 39.0–52.0)
Hemoglobin: 16.8 g/dL (ref 13.0–17.0)
MCHC: 34 g/dL (ref 30.0–36.0)
MCV: 84.6 fl (ref 78.0–100.0)
Platelets: 167 10*3/uL (ref 150.0–400.0)
RBC: 5.85 Mil/uL — ABNORMAL HIGH (ref 4.22–5.81)
RDW: 13.1 % (ref 11.5–15.5)
WBC: 6.2 10*3/uL (ref 4.0–10.5)

## 2022-08-15 LAB — COMPREHENSIVE METABOLIC PANEL
ALT: 16 U/L (ref 0–53)
AST: 17 U/L (ref 0–37)
Albumin: 4.3 g/dL (ref 3.5–5.2)
Alkaline Phosphatase: 92 U/L (ref 39–117)
BUN: 18 mg/dL (ref 6–23)
CO2: 33 mEq/L — ABNORMAL HIGH (ref 19–32)
Calcium: 9.4 mg/dL (ref 8.4–10.5)
Chloride: 102 mEq/L (ref 96–112)
Creatinine, Ser: 1.03 mg/dL (ref 0.40–1.50)
GFR: 84.26 mL/min (ref >60.00)
Glucose, Bld: 96 mg/dL (ref 70–99)
Potassium: 4.5 mEq/L (ref 3.5–5.1)
Sodium: 139 mEq/L (ref 135–145)
Total Bilirubin: 0.4 mg/dL (ref 0.2–1.2)
Total Protein: 7.3 g/dL (ref 6.0–8.3)

## 2022-08-15 LAB — LIPID PANEL
Cholesterol: 225 mg/dL — ABNORMAL HIGH (ref 0–200)
HDL: 34.6 mg/dL — ABNORMAL LOW (ref >39.00)
NonHDL: 190.84
Total CHOL/HDL Ratio: 7
Triglycerides: 229 mg/dL — ABNORMAL HIGH (ref 0.0–149.0)
VLDL: 45.8 mg/dL — ABNORMAL HIGH (ref 0.0–40.0)

## 2022-08-15 LAB — TSH: TSH: 3.12 u[IU]/mL (ref 0.35–5.50)

## 2022-08-15 LAB — PSA: PSA: 0.68 ng/mL (ref 0.10–4.00)

## 2022-08-15 LAB — LDL CHOLESTEROL, DIRECT: Direct LDL: 150 mg/dL

## 2022-08-15 LAB — HEMOGLOBIN A1C: Hgb A1c MFr Bld: 5.4 % (ref 4.6–6.5)

## 2022-08-15 NOTE — Progress Notes (Signed)
Established Patient Office Visit  Subjective   Patient ID: Nathaniel Cook, male    DOB: 1970/09/13  Age: 52 y.o. MRN: [id number]  Chief Complaint  Patient presents with   Establish Care    HPI  for complete physical and follow up of chronic conditions.  Immunizations: -Tetanus: Completed in  today -Influenza: Completed this season update -Shingles: information discussed in office  -Pneumonia: too young  Covid: vaccine x 2   -HPV: aged out  Diet: [place]. 2-3 meals a day. States that he will drink OJ and water Exercise:  Physical labor. Sometimes he will walk   Eye exam: PRN Dental exam: Completes semi-annually   Colonoscopy: Needs referral to [facility]  Lung Cancer Screening: Does not qualify Dexa: N/A  PSA: Due  Sleep: 10-11 bedtime and will get up 5-6a. Feels rested. Does snore.      Review of Systems  Constitutional:  Negative for chills and fever.  Respiratory:  Negative for shortness of breath.   Cardiovascular:  Negative for chest pain and leg swelling.  Gastrointestinal:  Negative for abdominal pain, blood in stool, constipation, diarrhea, nausea and vomiting.       BM every other day  Genitourinary:  Negative for dysuria and hematuria.  Neurological:  Negative for tingling and headaches.  Psychiatric/Behavioral:  Negative for hallucinations and suicidal ideas.       Objective:     BP 122/78   Pulse 68   Ht 5\' 9"  (1.753 m)   Wt 188 lb (85.3 kg)   SpO2 97%   BMI 27.76 kg/m  BP Readings from Last 3 Encounters:  08/15/22 122/78  11/21/21 110/78  02/20/21 112/70   Wt Readings from Last 3 Encounters:  08/15/22 188 lb (85.3 kg)  11/21/21 187 lb 4 oz (84.9 kg)  02/20/21 186 lb 8 oz (84.6 kg)      Physical Exam Vitals and nursing note reviewed.  Constitutional:      Appearance: Normal appearance.  HENT:     Right Ear: Tympanic membrane, ear canal and external ear normal.     Left Ear: Tympanic membrane, ear canal and external ear  normal.     Mouth/Throat:     Mouth: Mucous membranes are moist.     Pharynx: Oropharynx is clear.  Eyes:     Extraocular Movements: Extraocular movements intact.     Pupils: Pupils are equal, round, and reactive to light.  Cardiovascular:     Rate and Rhythm: Normal rate and regular rhythm.     Pulses: Normal pulses.     Heart sounds: Normal heart sounds.  Pulmonary:     Effort: Pulmonary effort is normal.     Breath sounds: Normal breath sounds.  Abdominal:     General: Bowel sounds are normal. There is no distension.     Palpations: There is no mass.     Tenderness: There is no abdominal tenderness.     Hernia: No hernia is present.  Genitourinary:    Comments: Deferred per patient  Musculoskeletal:     Right lower leg: No edema.     Left lower leg: No edema.  Lymphadenopathy:     Cervical: No cervical adenopathy.  Skin:    General: Skin is warm.  Neurological:     General: No focal deficit present.     Mental Status: He is alert.     Deep Tendon Reflexes:     Reflex Scores:      Bicep reflexes are 2+ on  the right side and 2+ on the left side.      Patellar reflexes are 2+ on the right side and 2+ on the left side.    Comments: Bilateral upper and lower extremity strength 5/5  Psychiatric:        Mood and Affect: Mood normal.        Behavior: Behavior normal.        Thought Content: Thought content normal.        Judgment: Judgment normal.      No results found for any visits on 08/15/22.    The 10-year ASCVD risk score (Arnett DK, et al., 2019) is: 5.7%    Assessment & Plan:   Problem List Items Addressed This Visit       Other   Mixed hyperlipidemia    Patient currently maintained on atorvastatin.  Pending lipids today.  Continue take medication as prescribed.      Relevant Orders   Lipid panel   Preventative health care - Primary    Discussed age-appropriate immunizations and screening exams.  Updated flu and tetanus shot today.  Ambulatory  referral to GI for CRC screening.  PSA today for prostate cancer screening.  Patient was given information at discharge in regards to preventative healthcare maintenance with anticipatory guidance for his age range.      Relevant Orders   CBC   Comprehensive metabolic panel   Hemoglobin A1c   TSH   Lipid panel   Overweight    Work on healthy lifestyle modifications.      Relevant Orders   Hemoglobin A1c   Other Visit Diagnoses     Screening for prostate cancer       Relevant Orders   PSA   Need for immunization against influenza       Relevant Orders   Flu Vaccine QUAD 78mo+IM (Fluarix, Fluzone & Alfiuria Quad PF) (Completed)   Need for diphtheria-tetanus-pertussis (Tdap) vaccine       Relevant Orders   Tdap vaccine greater than or equal to 7yo IM (Completed)   Screening for colon cancer       Relevant Orders   Ambulatory referral to Gastroenterology       Return in about 1 year (around 08/16/2023) for CPE and Labs.    Romilda Garret, NP

## 2022-08-15 NOTE — Assessment & Plan Note (Signed)
Discussed age-appropriate immunizations and screening exams.  Updated flu and tetanus shot today.  Ambulatory referral to GI for CRC screening.  PSA today for prostate cancer screening.  Patient was given information at discharge in regards to preventative healthcare maintenance with anticipatory guidance for his age range.

## 2022-08-15 NOTE — Patient Instructions (Signed)
Nice to see you today I will be in touch with the labs once I have them Follow up with me in 1 year for the next physical, sooner if you need me

## 2022-08-15 NOTE — Assessment & Plan Note (Signed)
Patient currently maintained on atorvastatin.  Pending lipids today.  Continue take medication as prescribed.

## 2022-08-15 NOTE — Assessment & Plan Note (Signed)
Work on healthy lifestyle modifications. 

## 2022-10-23 ENCOUNTER — Other Ambulatory Visit: Payer: Self-pay

## 2022-10-23 DIAGNOSIS — E785 Hyperlipidemia, unspecified: Secondary | ICD-10-CM

## 2022-10-24 MED ORDER — ATORVASTATIN CALCIUM 20 MG PO TABS: 20.0000 mg | ORAL_TABLET | Freq: Every day | ORAL | 2 refills | Status: DC

## 2022-12-04 ENCOUNTER — Ambulatory Visit (INDEPENDENT_AMBULATORY_CARE_PROVIDER_SITE_OTHER): Payer: BC Managed Care – PPO | Admitting: Nurse Practitioner

## 2022-12-04 ENCOUNTER — Encounter: Payer: Self-pay | Admitting: Nurse Practitioner

## 2022-12-04 VITALS — BP 124/78 | HR 70 | Temp 99.1°F | Resp 16 | Ht 69.0 in | Wt 189.2 lb

## 2022-12-04 DIAGNOSIS — R42 Dizziness and giddiness: Secondary | ICD-10-CM | POA: Diagnosis not present

## 2022-12-04 DIAGNOSIS — R6889 Other general symptoms and signs: Secondary | ICD-10-CM | POA: Insufficient documentation

## 2022-12-04 DIAGNOSIS — M79602 Pain in left arm: Secondary | ICD-10-CM | POA: Diagnosis not present

## 2022-12-04 DIAGNOSIS — R5383 Other fatigue: Secondary | ICD-10-CM | POA: Insufficient documentation

## 2022-12-04 LAB — COMPREHENSIVE METABOLIC PANEL WITH GFR
ALT: 32 U/L (ref 0–53)
AST: 26 U/L (ref 0–37)
Albumin: 4.2 g/dL (ref 3.5–5.2)
Alkaline Phosphatase: 83 U/L (ref 39–117)
BUN: 22 mg/dL (ref 6–23)
CO2: 30 meq/L (ref 19–32)
Calcium: 8.8 mg/dL (ref 8.4–10.5)
Chloride: 105 meq/L (ref 96–112)
Creatinine, Ser: 1.05 mg/dL (ref 0.40–1.50)
GFR: 82.16 mL/min
Glucose, Bld: 102 mg/dL — ABNORMAL HIGH (ref 70–99)
Potassium: 3.8 meq/L (ref 3.5–5.1)
Sodium: 141 meq/L (ref 135–145)
Total Bilirubin: 0.4 mg/dL (ref 0.2–1.2)
Total Protein: 7.3 g/dL (ref 6.0–8.3)

## 2022-12-04 LAB — CBC
HCT: 44.7 % (ref 39.0–52.0)
Hemoglobin: 15 g/dL (ref 13.0–17.0)
MCHC: 33.6 g/dL (ref 30.0–36.0)
MCV: 84.7 fl (ref 78.0–100.0)
Platelets: 153 K/uL (ref 150.0–400.0)
RBC: 5.27 Mil/uL (ref 4.22–5.81)
RDW: 12.7 % (ref 11.5–15.5)
WBC: 7.6 K/uL (ref 4.0–10.5)

## 2022-12-04 LAB — VITAMIN D 25 HYDROXY (VIT D DEFICIENCY, FRACTURES): VITD: 30.32 ng/mL (ref 30.00–100.00)

## 2022-12-04 LAB — VITAMIN B12: Vitamin B-12: 285 pg/mL (ref 211–911)

## 2022-12-04 MED ORDER — METHYLPREDNISOLONE ACETATE 80 MG/ML IJ SUSP
80.0000 mg | Freq: Once | INTRAMUSCULAR | Status: AC
  Administered 2022-12-04: 80 mg via INTRAMUSCULAR

## 2022-12-04 NOTE — Assessment & Plan Note (Signed)
Less than 24 hours of lightheadedness.  Has not resolved.  Neurological exam benign TM exam benign patient does have allergies but has not had any as of late.  Will check basic labs patient does work in [employer] encourage patient to stay hydrated

## 2022-12-04 NOTE — Assessment & Plan Note (Signed)
Left arm pain with altered temperature sensation.  Exam benign in office will administer Depo-Medrol 80 mg x 1 dose to see if it is nerve related no injury per patient report follow-up if no improvement

## 2022-12-04 NOTE — Progress Notes (Signed)
Acute Office Visit  Subjective:     Patient ID: Nathaniel Cook, male    DOB: Nov 06, 1970, 52 y.o.   MRN: [id number]  Chief Complaint  Patient presents with   Arm Pain    Left arm X 1 month and feels cold. The pain comes and goes mid to lower part of the arm. Feels like there is ice in the wrist.   Fatigue     Patient is in today for multiple complaints with a history of HLD and seasonal allergies   Arm pain: States that it started approx 1 month ago. States that the pain started at his wrist and thought it was his watch. States that it went up the arm to the elbow. States that it is intermittent and feels like ice. No numbness or tingling. States that he does have weakness.  Has not tried any otc medications.   Fatigue: yesterday he started feeling that way. States that he was doing some work states that he felt light headed. 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 States that he woke up this morning and felt the same. States that he feels better now. States that he has been drinking plenty of water. States that he does work [employer] but has not worked over the past week. He did have breakfast this morning but not sure if that made him feel better    Patinet does take vitamin D and omega 3 fish oils   No history of stroke of heart attack in the family   Review of Systems  Constitutional:  Positive for malaise/fatigue. Negative for chills and fever.  Respiratory:  Negative for cough and shortness of breath.   Cardiovascular:  Negative for chest pain.  Musculoskeletal:  Positive for joint pain.  Neurological:  Positive for dizziness and focal weakness. Negative for tingling.        Objective:    BP 124/78   Pulse 70   Temp 99.1 F (37.3 C)   Resp 16   Ht 5\' 9"  (1.753 m)   Wt 189 lb 4 oz (85.8 kg)   SpO2 97%   BMI 27.95 kg/m  BP Readings from Last 3 Encounters:  12/04/22 124/78  08/15/22 122/78  11/21/21 110/78   Wt Readings from Last 3  Encounters:  12/04/22 189 lb 4 oz (85.8 kg)  08/15/22 188 lb (85.3 kg)  11/21/21 187 lb 4 oz (84.9 kg)      Physical Exam Vitals and nursing note reviewed.  Constitutional:      Appearance: Normal appearance.  HENT:     Right Ear: Ear canal and external ear normal. Tympanic membrane is scarred.     Left Ear: Ear canal and external ear normal. Tympanic membrane is scarred.     Mouth/Throat:     Mouth: Mucous membranes are moist.     Pharynx: Oropharynx is clear.  Eyes:     Extraocular Movements: Extraocular movements intact.     Pupils: Pupils are equal, round, and reactive to light.  Cardiovascular:     Rate and Rhythm: Normal rate and regular rhythm.     Heart sounds: Normal heart sounds.  Pulmonary:     Effort: Pulmonary effort is normal.     Breath sounds: Normal breath sounds.  Lymphadenopathy:     Cervical: No cervical adenopathy.  Skin:         Comments: Area feels cold to patient  Bilateral skin temperature equal Bilateral radial pulses 2+  Skin  color equal bilaterally   Neurological:     General: No focal deficit present.     Mental Status: He is alert.     Cranial Nerves: No facial asymmetry.     Sensory: Sensory deficit present.     Motor: Motor function is intact.     Coordination: Coordination is intact. Finger-Nose-Finger Test normal.     Gait: Gait is intact.     Deep Tendon Reflexes:     Reflex Scores:      Bicep reflexes are 2+ on the right side and 2+ on the left side.      Patellar reflexes are 2+ on the right side and 2+ on the left side.    Comments: Bilateral upper and lower extremity strength 5/5     No results found for any visits on 12/04/22.      Assessment & Plan:   Problem List Items Addressed This Visit       Other   Left arm pain    Left arm pain with altered temperature sensation.  Exam benign in office will administer Depo-Medrol 80 mg x 1 dose to see if it is nerve related no injury per patient report follow-up if no  improvement      Fatigue    Could be multifactorial patient feeling some better today.  He does take over-the-counter vitamin D will check vitamin D, CBC, CMP and B12 level.  B12 level given altered sensation on the patient's left lateral arm      Relevant Orders   CBC   Comprehensive metabolic panel   Vitamin B12   VITAMIN D 25 Hydroxy (Vit-D Deficiency, Fractures)   Impaired regulation of body temperature - Primary    Unclear cause or etiology.  Could be nerve related.  Depo-Medrol 80 mg IM x 1 dose      Lightheaded    Less than 24 hours of lightheadedness.  Has not resolved.  Neurological exam benign TM exam benign patient does have allergies but has not had any as of late.  Will check basic labs patient does work in [employer] encourage patient to stay hydrated      Relevant Orders   CBC   Comprehensive metabolic panel    Meds ordered this encounter  Medications   methylPREDNISolone acetate (DEPO-MEDROL) injection 80 mg    Return in about 9 months (around 09/06/2023), or if symptoms worsen or fail to improve, for CPE and Labs.  Audria Nine, NP

## 2022-12-04 NOTE — Assessment & Plan Note (Signed)
Unclear cause or etiology.  Could be nerve related.  Depo-Medrol 80 mg IM x 1 dose

## 2022-12-04 NOTE — Patient Instructions (Signed)
Nice to see you today Let me know if the lightheadedness returns Let me know if the sensation in the arm does not normalize

## 2022-12-04 NOTE — Assessment & Plan Note (Signed)
Could be multifactorial patient feeling some better today.  He does take over-the-counter vitamin D will check vitamin D, CBC, CMP and B12 level.  B12 level given altered sensation on the patient's left lateral arm

## 2023-09-05 ENCOUNTER — Other Ambulatory Visit: Payer: Self-pay | Admitting: Nurse Practitioner

## 2023-09-05 DIAGNOSIS — E785 Hyperlipidemia, unspecified: Secondary | ICD-10-CM

## 2023-09-10 ENCOUNTER — Encounter: Payer: BLUE CROSS/BLUE SHIELD | Admitting: Nurse Practitioner

## 2023-09-13 ENCOUNTER — Encounter: Payer: BLUE CROSS/BLUE SHIELD | Admitting: Nurse Practitioner

## 2024-01-09 ENCOUNTER — Encounter: Admitting: Nurse Practitioner

## 2024-04-02 ENCOUNTER — Ambulatory Visit: Payer: Self-pay | Admitting: Family Medicine

## 2024-04-02 ENCOUNTER — Ambulatory Visit (INDEPENDENT_AMBULATORY_CARE_PROVIDER_SITE_OTHER): Admitting: Family Medicine

## 2024-04-02 ENCOUNTER — Encounter: Payer: Self-pay | Admitting: Family Medicine

## 2024-04-02 VITALS — BP 122/82 | HR 60 | Temp 97.9°F | Ht 69.0 in | Wt 179.2 lb

## 2024-04-02 DIAGNOSIS — Z125 Encounter for screening for malignant neoplasm of prostate: Secondary | ICD-10-CM | POA: Diagnosis not present

## 2024-04-02 DIAGNOSIS — R109 Unspecified abdominal pain: Secondary | ICD-10-CM | POA: Insufficient documentation

## 2024-04-02 DIAGNOSIS — E7800 Pure hypercholesterolemia, unspecified: Secondary | ICD-10-CM | POA: Insufficient documentation

## 2024-04-02 LAB — POCT URINALYSIS DIPSTICK
Bilirubin, UA: NEGATIVE
Blood, UA: NEGATIVE
Glucose, UA: NEGATIVE
Ketones, UA: NEGATIVE
Leukocytes, UA: NEGATIVE
Nitrite, UA: NEGATIVE
Protein, UA: NEGATIVE
Spec Grav, UA: 1.015
Urobilinogen, UA: 0.2 U/dL
pH, UA: 6

## 2024-04-02 LAB — COMPREHENSIVE METABOLIC PANEL WITH GFR
ALT: 14 U/L (ref 0–53)
AST: 19 U/L (ref 0–37)
Albumin: 4 g/dL (ref 3.5–5.2)
Alkaline Phosphatase: 78 U/L (ref 39–117)
BUN: 19 mg/dL (ref 6–23)
CO2: 33 meq/L — ABNORMAL HIGH (ref 19–32)
Calcium: 9 mg/dL (ref 8.4–10.5)
Chloride: 102 meq/L (ref 96–112)
Creatinine, Ser: 1.02 mg/dL (ref 0.40–1.50)
GFR: 84.28 mL/min
Glucose, Bld: 102 mg/dL — ABNORMAL HIGH (ref 70–99)
Potassium: 4.3 meq/L (ref 3.5–5.1)
Sodium: 140 meq/L (ref 135–145)
Total Bilirubin: 0.4 mg/dL (ref 0.2–1.2)
Total Protein: 7 g/dL (ref 6.0–8.3)

## 2024-04-02 LAB — CBC WITH DIFFERENTIAL/PLATELET
Basophils Absolute: 0 10*3/uL (ref 0.0–0.1)
Basophils Relative: 0.6 % (ref 0.0–3.0)
Eosinophils Absolute: 0.1 10*3/uL (ref 0.0–0.7)
Eosinophils Relative: 3 % (ref 0.0–5.0)
HCT: 47.2 % (ref 39.0–52.0)
Hemoglobin: 15.8 g/dL (ref 13.0–17.0)
Lymphocytes Relative: 37.9 % (ref 12.0–46.0)
Lymphs Abs: 1.6 10*3/uL (ref 0.7–4.0)
MCHC: 33.4 g/dL (ref 30.0–36.0)
MCV: 83.1 fl (ref 78.0–100.0)
Monocytes Absolute: 0.4 10*3/uL (ref 0.1–1.0)
Monocytes Relative: 9.1 % (ref 3.0–12.0)
Neutro Abs: 2.1 10*3/uL (ref 1.4–7.7)
Neutrophils Relative %: 49.4 % (ref 43.0–77.0)
Platelets: 178 10*3/uL (ref 150.0–400.0)
RBC: 5.68 Mil/uL (ref 4.22–5.81)
RDW: 12.9 % (ref 11.5–15.5)
WBC: 4.2 10*3/uL (ref 4.0–10.5)

## 2024-04-02 LAB — LIPID PANEL
Cholesterol: 211 mg/dL — ABNORMAL HIGH (ref 0–200)
HDL: 33.1 mg/dL — ABNORMAL LOW (ref >39.00)
LDL Cholesterol: 104 mg/dL — ABNORMAL HIGH (ref 0–99)
NonHDL: 178.18
Total CHOL/HDL Ratio: 6
Triglycerides: 371 mg/dL — ABNORMAL HIGH (ref 0.0–149.0)
VLDL: 74.2 mg/dL — ABNORMAL HIGH (ref 0.0–40.0)

## 2024-04-02 LAB — PSA: PSA: 0.65 ng/mL (ref 0.10–4.00)

## 2024-04-02 NOTE — Patient Instructions (Addendum)
 Please stop at the lab to have labs drawn. Keep up with  water intake.  Start gentle stretching, can use heat on low back.   Follow up if pain not resolving.

## 2024-04-02 NOTE — Assessment & Plan Note (Signed)
 Acute, now with significant improvement.  Possible passed kidney stone.  No current blood or sign of infection in urine. Also on differential would be muscle strain.  Will evaluate with labs. Encourage patient to push water, and use heat, Tylenol and gentle stretching for possible low back strain.  Return and ER precautions provided.

## 2024-04-02 NOTE — Progress Notes (Signed)
 Patient ID: Nathaniel Cook, male    DOB: 02/25/1971, 53 y.o.   MRN: [id number]  This visit was conducted in person.  BP 122/82   Pulse 60   Temp 97.9 F (36.6 C) (Oral)   Ht 5' 9 (1.753 m)   Wt 179 lb 4 oz (81.3 kg)   SpO2 99%   BMI 26.47 kg/m    CC:  Chief Complaint  Patient presents with   Flank Pain    Pt here for flank pain on R side. Started two weeks ago. PT states it's a sharp pain    Subjective:   HPI: Nathaniel Cook is a 53 y.o. male presenting on 04/02/2024 for Flank Pain (Pt here for flank pain on R side. Started two weeks ago. PT states it's a sharp pain)  Pt of Nathaniel Cook , NP  Patient reports new onset flank pain on right ongoing x 2 weeks.  Describes pain as sharp.  Started as constant now intermittent and mild.  Pt report no N/V, Constipation  Occ diarrhea in AM.  Last week felt hot.SABRA got better after taking tylenol.   No dysuria, no blood in urine. No history of nephrolithiasis listed on problem list but pt reports history of passing stones 18 years ago, none since.  Has  tried to have more lemon and squash in case it was a stone.      Last chol 07/2022... took statin  until 3 months ago... stopped because he ran oiut.  Made changes with diet and wishes to recheck.  Relevant past medical, surgical, family and social history reviewed and updated as indicated. Interim medical history since our last visit reviewed. Allergies and medications reviewed and updated. Outpatient Medications Prior to Visit  Medication Sig Dispense Refill   atorvastatin  (LIPITOR) 20 MG tablet Take 1 tablet by mouth once daily (Patient not taking: Reported on 04/02/2024) 90 tablet 0   cholecalciferol (VITAMIN D3) 25 MCG (1000 UNIT) tablet Take 1,000 Units by mouth daily. (Patient not taking: Reported on 04/02/2024)     Omega 3 1000 MG CAPS Take 1,000 mg by mouth daily. (Patient not taking: Reported on 04/02/2024)     No facility-administered medications prior to visit.     Per HPI  unless specifically indicated in ROS section below Review of Systems  Constitutional:  Negative for fatigue and fever.  HENT:  Negative for ear pain.   Eyes:  Negative for pain.  Respiratory:  Negative for cough and shortness of breath.   Cardiovascular:  Negative for chest pain, palpitations and leg swelling.  Gastrointestinal:  Positive for abdominal pain.  Genitourinary:  Negative for dysuria.  Musculoskeletal:  Negative for arthralgias.  Neurological:  Negative for syncope, light-headedness and headaches.  Psychiatric/Behavioral:  Negative for dysphoric mood.    Objective:  BP 122/82   Pulse 60   Temp 97.9 F (36.6 C) (Oral)   Ht 5' 9 (1.753 m)   Wt 179 lb 4 oz (81.3 kg)   SpO2 99%   BMI 26.47 kg/m   Wt Readings from Last 3 Encounters:  04/02/24 179 lb 4 oz (81.3 kg)  12/04/22 189 lb 4 oz (85.8 kg)  08/15/22 188 lb (85.3 kg)      Physical Exam Vitals reviewed.  Constitutional:      Appearance: He is well-developed.  HENT:     Head: Normocephalic.     Right Ear: Hearing normal.     Left Ear: Hearing normal.     Nose: Nose  normal.  Neck:     Thyroid : No thyroid  mass or thyromegaly.     Vascular: No carotid bruit.     Trachea: Trachea normal.  Cardiovascular:     Rate and Rhythm: Normal rate and regular rhythm.     Pulses: Normal pulses.     Heart sounds: Heart sounds not distant. No murmur heard.    No friction rub. No gallop.     Comments: No peripheral edema Pulmonary:     Effort: Pulmonary effort is normal. No respiratory distress.     Breath sounds: Normal breath sounds.  Abdominal:     Tenderness: There is abdominal tenderness. There is left CVA tenderness. There is no guarding or rebound.     Hernia: No hernia is present.     Comments:  Left flank, mildly tender  Skin:    General: Skin is warm and dry.     Findings: No rash.  Psychiatric:        Speech: Speech normal.        Behavior: Behavior normal.        Thought Content: Thought content  normal.       Results for orders placed or performed in visit on 04/02/24  POCT urinalysis dipstick   Collection Time: 04/02/24  8:56 AM  Result Value Ref Range   Color, UA yellow    Clarity, UA clear    Glucose, UA Negative Negative   Bilirubin, UA negative    Ketones, UA negative    Spec Grav, UA 1.015 1.010 - 1.025   Blood, UA negative    pH, UA 6.0 5.0 - 8.0   Protein, UA Negative Negative   Urobilinogen, UA 0.2 0.2 or 1.0 E.U./dL   Nitrite, UA negative    Leukocytes, UA Negative Negative   Appearance     Odor      Assessment and Plan  Acute right flank pain Assessment & Plan: Acute, now with significant improvement.  Possible passed kidney stone.  No current blood or sign of infection in urine. Also on differential would be muscle strain.  Will evaluate with labs. Encourage patient to push water, and use heat, Tylenol and gentle stretching for possible low back strain.  Return and ER precautions provided.  Orders: -     POCT urinalysis dipstick -     CBC with Differential/Platelet  Hypercholesteremia Assessment & Plan: Chronic, patient reports he has been working on lifestyle changes.  He has not been on the atorvastatin  for approximately 3 months given his prescription ran out.  He is interested in checking his cholesterol today given he missed his appointment with his PCP for his annual exam.  Recommended lab evaluation today for prephysical labs and patient will schedule annual physical with PCP within the next 1 to 2 months when he is in town.  Orders: -     Lipid panel -     Comprehensive metabolic panel with GFR  Prostate cancer screening -     PSA    Return in about 4 weeks (around 04/30/2024) for  annual, NO labs prior with PCP [facility].   Greig Ring, MD

## 2024-04-02 NOTE — Assessment & Plan Note (Signed)
 Chronic, patient reports he has been working on lifestyle changes.  He has not been on the atorvastatin  for approximately 3 months given his prescription ran out.  He is interested in checking his cholesterol today given he missed his appointment with his PCP for his annual exam.  Recommended lab evaluation today for prephysical labs and patient will schedule annual physical with PCP within the next 1 to 2 months when he is in town.
